# Patient Record
Sex: Male | Born: 1995
Health system: Southern US, Community
[De-identification: ages and names within clinical notes are randomized; demographics above are authoritative.]

---
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
## 2016-03-21 DIAGNOSIS — J329 Chronic sinusitis, unspecified: Secondary | ICD-10-CM | POA: Diagnosis not present

## 2016-03-21 DIAGNOSIS — J309 Allergic rhinitis, unspecified: Secondary | ICD-10-CM | POA: Diagnosis not present

## 2017-01-06 ENCOUNTER — Emergency Department (HOSPITAL_COMMUNITY): Payer: BLUE CROSS/BLUE SHIELD

## 2017-01-06 ENCOUNTER — Encounter (HOSPITAL_COMMUNITY): Payer: Self-pay | Admitting: *Deleted

## 2017-01-06 ENCOUNTER — Emergency Department (HOSPITAL_COMMUNITY)
Admission: EM | Admit: 2017-01-06 | Discharge: 2017-01-06 | Disposition: A | Discharge status: Home / Self Care | Payer: BLUE CROSS/BLUE SHIELD | Attending: Emergency Medicine | Admitting: Emergency Medicine

## 2017-01-06 DIAGNOSIS — M25562 Pain in left knee: Secondary | ICD-10-CM | POA: Diagnosis not present

## 2017-01-06 DIAGNOSIS — S20212A Contusion of left front wall of thorax, initial encounter: Secondary | ICD-10-CM | POA: Insufficient documentation

## 2017-01-06 DIAGNOSIS — Y9389 Activity, other specified: Secondary | ICD-10-CM | POA: Insufficient documentation

## 2017-01-06 DIAGNOSIS — S299XXA Unspecified injury of thorax, initial encounter: Secondary | ICD-10-CM | POA: Diagnosis not present

## 2017-01-06 DIAGNOSIS — Y92410 Unspecified street and highway as the place of occurrence of the external cause: Secondary | ICD-10-CM | POA: Diagnosis not present

## 2017-01-06 DIAGNOSIS — S80211A Abrasion, right knee, initial encounter: Secondary | ICD-10-CM | POA: Diagnosis not present

## 2017-01-06 DIAGNOSIS — Y999 Unspecified external cause status: Secondary | ICD-10-CM | POA: Diagnosis not present

## 2017-01-06 DIAGNOSIS — R079 Chest pain, unspecified: Secondary | ICD-10-CM | POA: Diagnosis not present

## 2017-01-06 DIAGNOSIS — S8992XA Unspecified injury of left lower leg, initial encounter: Secondary | ICD-10-CM | POA: Diagnosis not present

## 2017-01-06 DIAGNOSIS — S8991XA Unspecified injury of right lower leg, initial encounter: Secondary | ICD-10-CM | POA: Diagnosis not present

## 2017-01-06 DIAGNOSIS — M25561 Pain in right knee: Secondary | ICD-10-CM | POA: Diagnosis not present
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
## 2017-01-06 NOTE — ED Notes (Signed)
Pt given incentive spirometer and gave verbal instructions to him and mother on how to use this. Teach back method was used.

## 2017-01-06 NOTE — ED Notes (Signed)
ED Provider at bedside. 

## 2017-03-20 DIAGNOSIS — I10 Essential (primary) hypertension: Secondary | ICD-10-CM | POA: Diagnosis not present

## 2017-03-20 DIAGNOSIS — R5381 Other malaise: Secondary | ICD-10-CM | POA: Diagnosis not present

## 2017-03-20 DIAGNOSIS — Z0000 Encounter for general adult medical examination without abnormal findings: Secondary | ICD-10-CM | POA: Diagnosis not present

## 2017-03-31 DIAGNOSIS — R0789 Other chest pain: Secondary | ICD-10-CM | POA: Diagnosis not present

## 2017-03-31 DIAGNOSIS — R079 Chest pain, unspecified: Secondary | ICD-10-CM | POA: Diagnosis not present

## 2017-03-31 DIAGNOSIS — I319 Disease of pericardium, unspecified: Secondary | ICD-10-CM | POA: Diagnosis not present

## 2017-03-31 DIAGNOSIS — S299XXS Unspecified injury of thorax, sequela: Secondary | ICD-10-CM | POA: Diagnosis not present

## 2017-04-02 DIAGNOSIS — S299XXS Unspecified injury of thorax, sequela: Secondary | ICD-10-CM | POA: Diagnosis not present

## 2017-04-02 DIAGNOSIS — I319 Disease of pericardium, unspecified: Secondary | ICD-10-CM | POA: Diagnosis not present

## 2017-04-02 DIAGNOSIS — R079 Chest pain, unspecified: Secondary | ICD-10-CM | POA: Diagnosis not present

## 2017-04-09 ENCOUNTER — Emergency Department (HOSPITAL_COMMUNITY): Payer: BLUE CROSS/BLUE SHIELD

## 2017-04-09 ENCOUNTER — Emergency Department (HOSPITAL_COMMUNITY)
Admission: EM | Admit: 2017-04-09 | Discharge: 2017-04-09 | Disposition: A | Discharge status: Home / Self Care | Payer: BLUE CROSS/BLUE SHIELD | Attending: Emergency Medicine | Admitting: Emergency Medicine

## 2017-04-09 ENCOUNTER — Encounter (HOSPITAL_COMMUNITY): Payer: Self-pay | Admitting: Emergency Medicine

## 2017-04-09 DIAGNOSIS — R0789 Other chest pain: Secondary | ICD-10-CM | POA: Insufficient documentation

## 2017-04-09 DIAGNOSIS — S098XXA Other specified injuries of head, initial encounter: Secondary | ICD-10-CM | POA: Diagnosis not present

## 2017-04-09 DIAGNOSIS — Y92410 Unspecified street and highway as the place of occurrence of the external cause: Secondary | ICD-10-CM | POA: Insufficient documentation

## 2017-04-09 DIAGNOSIS — Y999 Unspecified external cause status: Secondary | ICD-10-CM | POA: Diagnosis not present

## 2017-04-09 DIAGNOSIS — S0990XA Unspecified injury of head, initial encounter: Secondary | ICD-10-CM | POA: Diagnosis not present

## 2017-04-09 DIAGNOSIS — S161XXA Strain of muscle, fascia and tendon at neck level, initial encounter: Secondary | ICD-10-CM | POA: Insufficient documentation

## 2017-04-09 DIAGNOSIS — R079 Chest pain, unspecified: Secondary | ICD-10-CM | POA: Diagnosis not present

## 2017-04-09 DIAGNOSIS — Y9389 Activity, other specified: Secondary | ICD-10-CM | POA: Diagnosis not present

## 2017-04-09 DIAGNOSIS — S199XXA Unspecified injury of neck, initial encounter: Secondary | ICD-10-CM | POA: Diagnosis not present

## 2017-04-09 MED ORDER — METHOCARBAMOL 500 MG PO TABS: 500.0000 mg | ORAL_TABLET | Freq: Two times a day (BID) | ORAL | 0 refills | Status: DC

## 2017-04-09 MED ORDER — IBUPROFEN 800 MG PO TABS: 800.0000 mg | ORAL_TABLET | Freq: Three times a day (TID) | ORAL | 0 refills | Status: DC | PRN

## 2017-04-09 NOTE — ED Notes (Signed)
Pt returned from xray
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
## 2017-04-09 NOTE — ED Notes (Signed)
Patient transported to X-ray 

## 2017-04-09 NOTE — Discharge Instructions (Signed)

## 2017-04-09 NOTE — ED Notes (Signed)
Patient is resting comfortably. 
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
## 2017-10-23 DIAGNOSIS — Z23 Encounter for immunization: Secondary | ICD-10-CM | POA: Diagnosis not present

## 2017-12-28 ENCOUNTER — Other Ambulatory Visit: Payer: Self-pay | Admitting: Cardiology

## 2017-12-28 ENCOUNTER — Ambulatory Visit
Admission: RE | Admit: 2017-12-28 | Discharge: 2017-12-28 | Disposition: A | Discharge status: Home / Self Care | Payer: BLUE CROSS/BLUE SHIELD | Source: Ambulatory Visit | Attending: Internal Medicine | Admitting: Internal Medicine

## 2017-12-28 ENCOUNTER — Ambulatory Visit
Admission: RE | Admit: 2017-12-28 | Discharge: 2017-12-28 | Disposition: A | Discharge status: Home / Self Care | Payer: BLUE CROSS/BLUE SHIELD | Source: Ambulatory Visit | Attending: Cardiology | Admitting: Cardiology

## 2017-12-28 DIAGNOSIS — R079 Chest pain, unspecified: Secondary | ICD-10-CM | POA: Diagnosis not present

## 2017-12-28 DIAGNOSIS — I319 Disease of pericardium, unspecified: Secondary | ICD-10-CM | POA: Diagnosis not present

## 2017-12-28 DIAGNOSIS — S299XXS Unspecified injury of thorax, sequela: Secondary | ICD-10-CM | POA: Diagnosis not present

## 2017-12-28 DIAGNOSIS — R0789 Other chest pain: Secondary | ICD-10-CM | POA: Diagnosis not present

## 2017-12-28 DIAGNOSIS — I7300 Raynaud's syndrome without gangrene: Secondary | ICD-10-CM | POA: Diagnosis not present

## 2017-12-28 DIAGNOSIS — R05 Cough: Secondary | ICD-10-CM | POA: Diagnosis not present

## 2017-12-29 DIAGNOSIS — I10 Essential (primary) hypertension: Secondary | ICD-10-CM | POA: Diagnosis not present

## 2017-12-29 DIAGNOSIS — R5381 Other malaise: Secondary | ICD-10-CM | POA: Diagnosis not present

## 2018-01-06 DIAGNOSIS — R0789 Other chest pain: Secondary | ICD-10-CM | POA: Diagnosis not present

## 2018-01-06 DIAGNOSIS — S299XXS Unspecified injury of thorax, sequela: Secondary | ICD-10-CM | POA: Diagnosis not present

## 2018-01-06 DIAGNOSIS — I7300 Raynaud's syndrome without gangrene: Secondary | ICD-10-CM | POA: Diagnosis not present

## 2018-01-06 DIAGNOSIS — I319 Disease of pericardium, unspecified: Secondary | ICD-10-CM | POA: Diagnosis not present

## 2018-03-16 DIAGNOSIS — R0789 Other chest pain: Secondary | ICD-10-CM | POA: Diagnosis not present

## 2018-03-16 DIAGNOSIS — I319 Disease of pericardium, unspecified: Secondary | ICD-10-CM | POA: Diagnosis not present

## 2018-03-16 DIAGNOSIS — S299XXS Unspecified injury of thorax, sequela: Secondary | ICD-10-CM | POA: Diagnosis not present

## 2018-03-16 DIAGNOSIS — I7300 Raynaud's syndrome without gangrene: Secondary | ICD-10-CM | POA: Diagnosis not present

## 2018-03-23 NOTE — Progress Notes (Signed)
03/24/2018 2:09 PM   Scott Sandoval [birth year] [id number]  Referring provider: Corky Downs, MD [address] [place], [place] [address]  Chief Complaint  Patient presents with  . New Patient (Initial Visit)    HPI: Patient is a 22 year old African American male referred by Dr. Corky Downs for testicular discomfort.    He states that he has been having left scrotal discomfort since mid March.  He has not noted any testicular swelling.  He states is not a sharp pain just sensitive.  He states the pain is intermittent.  He describes the pain is it just does not feel right.  He denied any injury to the scrotum, history of infection or history of surgery to his scrotum.  He also mentioned a history of spraying of the urine.  He states Dr. Harl Bowie gave him a topical cream to put on the end of his penis and spraying abated.    Patient denies any gross hematuria, dysuria or suprapubic/flank pain.  Patient denies any fevers, chills, nausea or vomiting.   He does not have a history of stones.    His UA is positive for 3-10 RBC's.  His PVR was 25 mL.     PMH: No past medical history on file.  Surgical History: No past surgical history on file.  Home Medications:  Allergies as of 03/24/2018   No Known Allergies     Medication List        Accurate as of 03/24/18 11:59 PM. Always use your most recent med list.          clindamycin-benzoyl peroxide gel Commonly known as:  BENZACLIN Apply daily to the face after washing. May need to start off every other day if drying occurs.   ibuprofen 800 MG tablet Commonly known as:  ADVIL,MOTRIN Take 1 tablet (800 mg total) by mouth every 8 (eight) hours as needed.   methocarbamol 500 MG tablet Commonly known as:  ROBAXIN Take 1 tablet (500 mg total) by mouth 2 (two) times daily.       Allergies: No Known Allergies  Family History: Family History  Problem Relation Age of Onset  . Kidney cancer Neg Hx   . Kidney disease Neg Hx    . Prostate cancer Neg Hx     Social History:  reports that he has never smoked. He has never used smokeless tobacco. He reports that he drinks alcohol. He reports that he has current or past drug history. Drug: Marijuana.  ROS: UROLOGY Frequent Urination?: No Hard to postpone urination?: No Burning/pain with urination?: No Get up at night to urinate?: No Leakage of urine?: No Urine stream starts and stops?: No Trouble starting stream?: No Do you have to strain to urinate?: No Blood in urine?: No Urinary tract infection?: No Sexually transmitted disease?: No Injury to kidneys or bladder?: No Painful intercourse?: No Weak stream?: No Erection problems?: No Penile pain?: No  Gastrointestinal Nausea?: No Vomiting?: No Indigestion/heartburn?: No Diarrhea?: No Constipation?: No  Constitutional Fever: No Night sweats?: No Weight loss?: No Fatigue?: No  Skin Skin rash/lesions?: No Itching?: No  Eyes Blurred vision?: No Double vision?: No  Ears/Nose/Throat Sore throat?: No Sinus problems?: No  Hematologic/Lymphatic Swollen glands?: No Easy bruising?: No  Cardiovascular Leg swelling?: No Chest pain?: No  Respiratory Cough?: No Shortness of breath?: No  Endocrine Excessive thirst?: No  Musculoskeletal Back pain?: No Joint pain?: No  Neurological Headaches?: No Dizziness?: No  Psychologic Depression?: No Anxiety?: No  Physical  Exam: BP 124/87   Pulse 85   Ht 5\' 11"  (1.803 m)   Wt 140 lb (63.5 kg)   BMI 19.53 kg/m   Constitutional: Well nourished. Alert and oriented, No acute distress. HEENT: [place] AT, moist mucus membranes. Trachea midline, no masses. Cardiovascular: No clubbing, cyanosis, or edema. Respiratory: Normal respiratory effort, no increased work of breathing. GI: Abdomen is soft, non tender, non distended, no abdominal masses. Liver and spleen not palpable.  No hernias appreciated.  Stool sample for occult testing is not  indicated.   GU: No CVA tenderness.  No bladder fullness or masses.  Patient with circumcised phallus.  Urethral meatus is patent.  No penile discharge. No penile lesions or rashes. Scrotum without lesions, cysts, rashes and/or edema.  Testicles are located scrotally bilaterally. No masses are appreciated in the testicles. Left and right epididymis are normal.  Small left varicocele, Grade I.    Rectal: Patient with  normal sphincter tone. Anus and perineum without scarring or rashes. No rectal masses are appreciated. Prostate is approximately 25 grams, no nodules are appreciated. Seminal vesicles are normal. Skin: No rashes, bruises or suspicious lesions. Lymph: No cervical or inguinal adenopathy. Neurologic: Grossly intact, no focal deficits, moving all 4 extremities. Psychiatric: Normal mood and affect.  Laboratory Data: No results found for: WBC, HGB, HCT, MCV, PLT  No results found for: CREATININE  No results found for: PSA  No results found for: TESTOSTERONE  No results found for: HGBA1C  No results found for: TSH  No results found for: CHOL, HDL, CHOLHDL, VLDL, LDLCALC  No results found for: AST No results found for: ALT No components found for: ALKALINEPHOPHATASE No components found for: BILIRUBINTOTAL  No results found for: ESTRADIOL  Urinalysis 3-10 RBC's.  See Epic.   I have reviewed the labs.   Pertinent Imaging: Results for Scott CoupeWIMPLE, Erinn (MRN [id number]) as of 04/02/2018 14:10  Ref. Range 03/24/2018 14:48  Scan Result Unknown 25      Assessment & Plan:    1. Testicular discomfort Likely due to a small varicocele, will treat conservatively at this time Will address further once CTU and/or infection is treated  2.  Microscopic hematuria I explained to the patient that there are a number of causes that can be associated with blood in the urine, such as stones, BPH, UTI's, damage to the urinary tract and/or cancer. I will send the urine for culture- if it  returns negative - will pursue CTU and if infection will treat and recheck I explained to the patient that a contrast material will be injected into a vein and that in rare instances, an allergic reaction can result and may even life threatening   The patient denies any allergies to contrast, iodine and/or seafood and is not taking metformin The patient had the opportunity to ask questions which were answered. Based upon this discussion, the patient is willing to proceed if urine culture is negative.    - UA  - Urine culture   Return for pending urine culture results .  These notes generated with voice recognition software. I apologize for typographical errors.  Michiel CowboySHANNON Sair Faulcon, PA-C  [facility] [address], [address] [place], [place] [address] [phone]

## 2018-03-24 ENCOUNTER — Encounter: Payer: Self-pay | Admitting: Urology

## 2018-03-24 ENCOUNTER — Ambulatory Visit (INDEPENDENT_AMBULATORY_CARE_PROVIDER_SITE_OTHER): Payer: BLUE CROSS/BLUE SHIELD | Admitting: Urology

## 2018-03-24 VITALS — BP 124/87 | HR 85 | Ht 71.0 in | Wt 140.0 lb

## 2018-03-24 DIAGNOSIS — R3129 Other microscopic hematuria: Secondary | ICD-10-CM

## 2018-03-24 DIAGNOSIS — N50819 Testicular pain, unspecified: Secondary | ICD-10-CM

## 2018-03-24 LAB — URINALYSIS, COMPLETE
BILIRUBIN UA: NEGATIVE
GLUCOSE, UA: NEGATIVE
Leukocytes, UA: NEGATIVE
Nitrite, UA: NEGATIVE
PROTEIN UA: NEGATIVE
RBC UA: NEGATIVE
SPEC GRAV UA: 1.025 (ref 1.005–1.030)
Urobilinogen, Ur: 1 mg/dL (ref 0.2–1.0)
pH, UA: 6.5 (ref 5.0–7.5)

## 2018-03-24 LAB — MICROSCOPIC EXAMINATION: Bacteria, UA: NONE SEEN

## 2018-03-24 LAB — BLADDER SCAN AMB NON-IMAGING: Scan Result: 25

## 2018-03-25 ENCOUNTER — Encounter: Payer: Self-pay | Admitting: Urology

## 2018-03-27 LAB — CULTURE, URINE COMPREHENSIVE

## 2018-03-29 ENCOUNTER — Other Ambulatory Visit: Payer: Self-pay | Admitting: Urology

## 2018-03-29 ENCOUNTER — Telehealth: Payer: Self-pay | Admitting: Radiology

## 2018-03-29 DIAGNOSIS — R3129 Other microscopic hematuria: Secondary | ICD-10-CM

## 2018-03-29 NOTE — Telephone Encounter (Signed)
Made pt aware of negative urine culture & need for CT Urogram. Appt made for results & pt aware that radiology scheduling will call him to schedule CT. Pt voices understanding of whole conversation.

## 2018-03-29 NOTE — Telephone Encounter (Signed)
Pt requesting urine culture results & questioning plan for follow up. Pt doesn't currently have a follow up appt & note isn't finished. Please advise.

## 2018-03-29 NOTE — Progress Notes (Signed)
CTU orders are in the chart.  

## 2018-03-29 NOTE — Telephone Encounter (Signed)
His urine culture is negative.  We need to proceed with the CTU.  I've placed orders for the CTU in the chart.

## 2018-04-05 ENCOUNTER — Ambulatory Visit
Admission: RE | Admit: 2018-04-05 | Discharge: 2018-04-05 | Disposition: A | Discharge status: Home / Self Care | Payer: BLUE CROSS/BLUE SHIELD | Source: Ambulatory Visit | Attending: Urology | Admitting: Urology

## 2018-04-05 DIAGNOSIS — R3129 Other microscopic hematuria: Secondary | ICD-10-CM | POA: Diagnosis not present

## 2018-04-05 MED ORDER — IOPAMIDOL (ISOVUE-300) INJECTION 61%
150.0000 mL | Freq: Once | INTRAVENOUS | Status: AC | PRN
  Administered 2018-04-05: 150 mL via INTRAVENOUS

## 2018-04-16 ENCOUNTER — Ambulatory Visit: Payer: Self-pay

## 2018-04-23 ENCOUNTER — Ambulatory Visit: Payer: BLUE CROSS/BLUE SHIELD | Admitting: Urology

## 2018-04-27 ENCOUNTER — Ambulatory Visit: Payer: BLUE CROSS/BLUE SHIELD | Admitting: Urology

## 2018-05-16 ENCOUNTER — Encounter: Payer: Self-pay | Admitting: Urology

## 2018-06-24 DIAGNOSIS — I7300 Raynaud's syndrome without gangrene: Secondary | ICD-10-CM | POA: Diagnosis not present

## 2018-06-24 DIAGNOSIS — K119 Disease of salivary gland, unspecified: Secondary | ICD-10-CM | POA: Diagnosis not present

## 2018-10-20 DIAGNOSIS — K119 Disease of salivary gland, unspecified: Secondary | ICD-10-CM | POA: Diagnosis not present

## 2018-10-20 DIAGNOSIS — H6692 Otitis media, unspecified, left ear: Secondary | ICD-10-CM | POA: Diagnosis not present

## 2018-10-20 DIAGNOSIS — I7300 Raynaud's syndrome without gangrene: Secondary | ICD-10-CM | POA: Diagnosis not present

## 2019-01-12 IMAGING — CT CT ABD-PEL WO/W CM
2 of 9 series · 14 of 46 positions shown, 18 images · IV contrast (iopamidol)
Comparison: None.

CLINICAL DATA: Microscopic hematuria.

EXAM:
CT ABDOMEN AND PELVIS WITHOUT AND WITH CONTRAST
TECHNIQUE: Multidetector CT imaging of the abdomen and pelvis was performed
following the standard protocol before and following the bolus
administration of intravenous contrast.
CONTRAST:  150mL 4JHFB9-TWW IOPAMIDOL (4JHFB9-TWW) INJECTION 61%

[Series 5: cor without without pre · coronal · non-contrast · 0.62mm/px · 3 of 121 slices shown]
[im 31/121  soft-tissue]
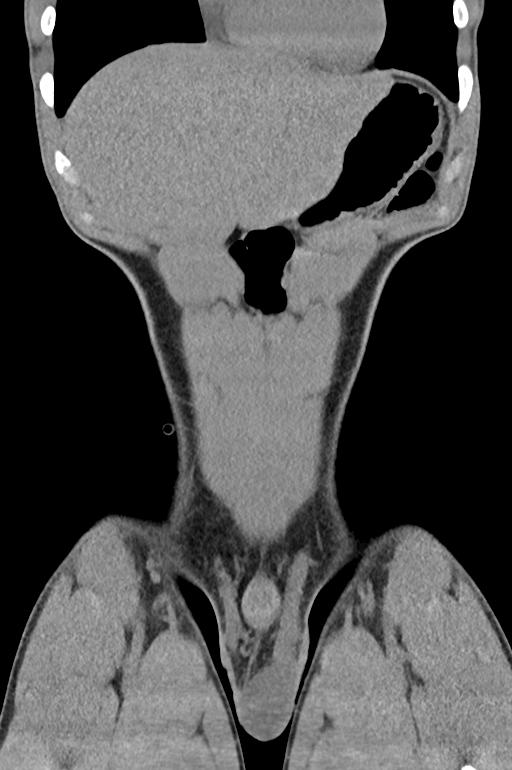
[im 61/121  soft-tissue]
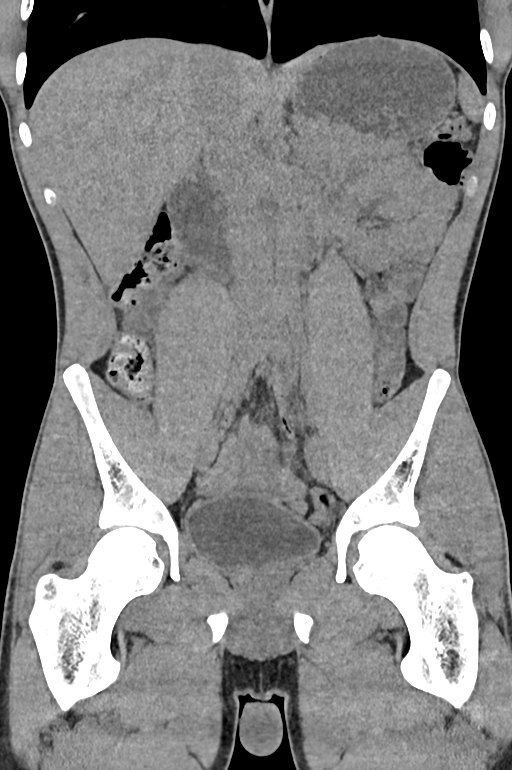
[im 91/121  soft-tissue]
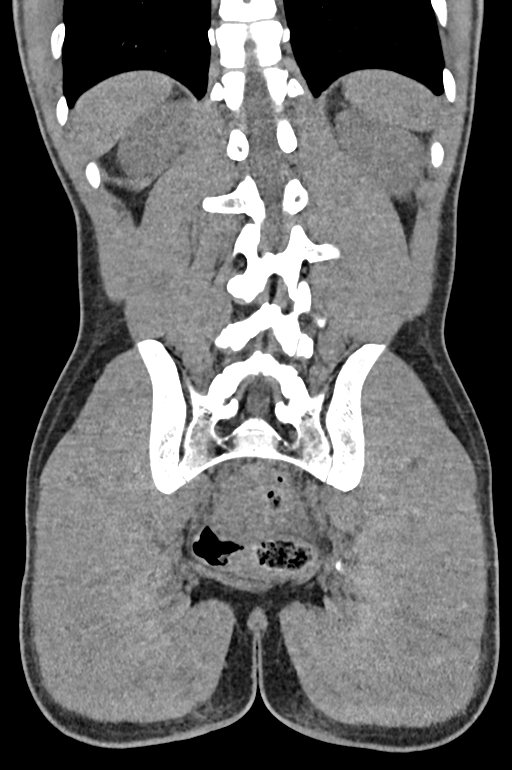

[Series 18: axial delay thins prone · axial · delayed · 0.56mm/px · z∈[-1557,-1099]mm · 11 of 345 slices shown, 15 images]
[im 20/345  soft-tissue]
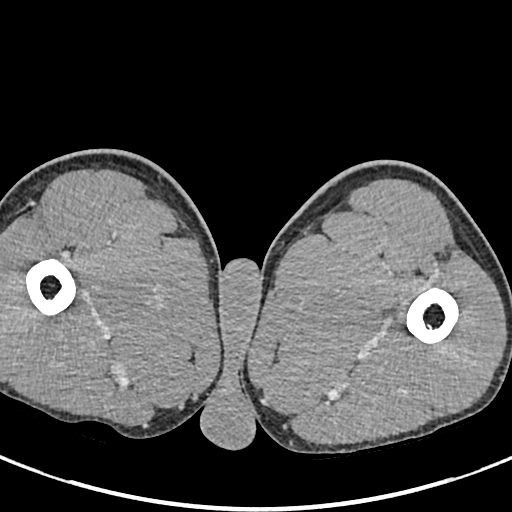
[im 20/345  bone]
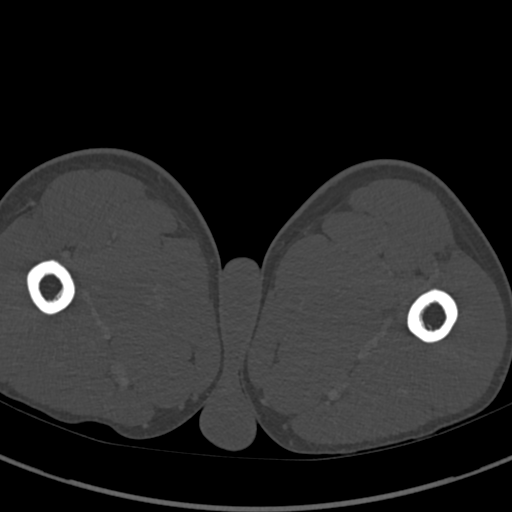
[im 58/345  soft-tissue]
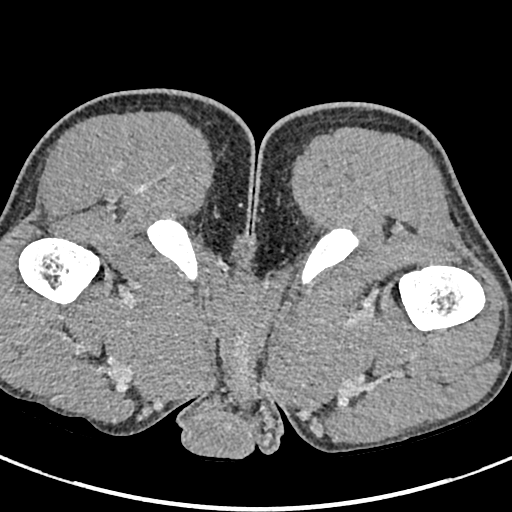
[im 96/345  soft-tissue]
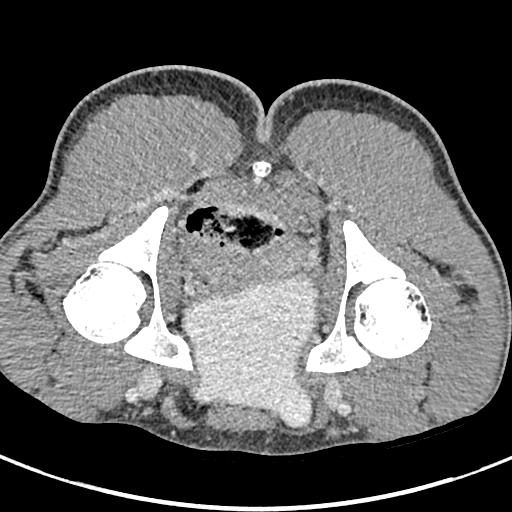
[im 134/345  soft-tissue]
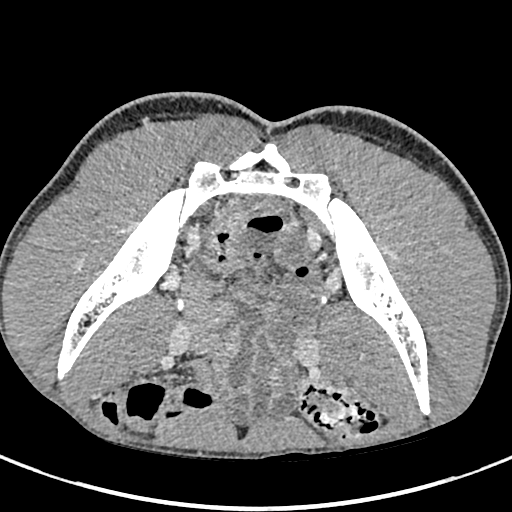
[im 173/345  soft-tissue]
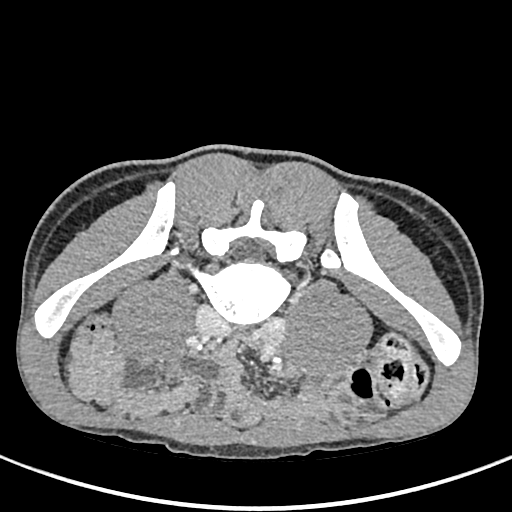
[im 211/345  soft-tissue]
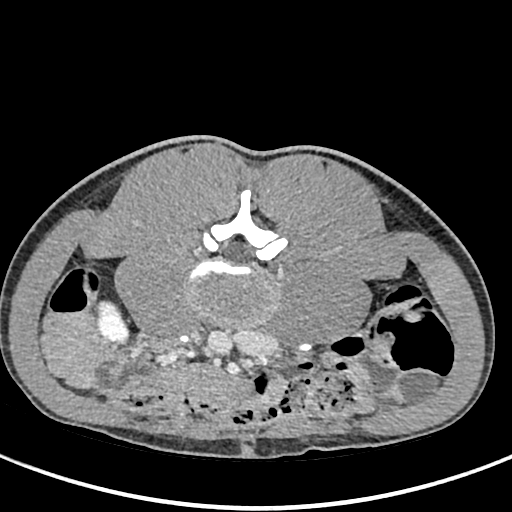
[im 249/345  soft-tissue]
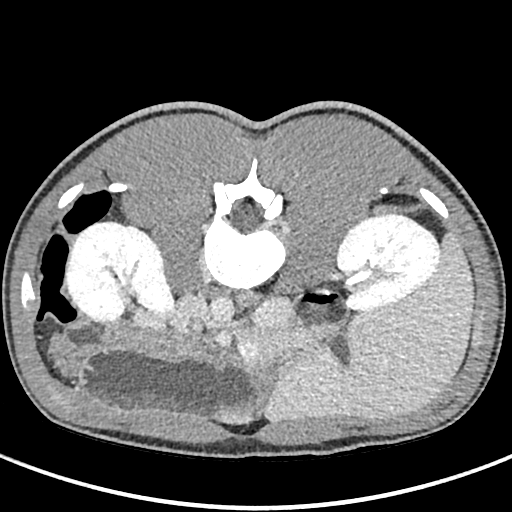
[im 268/345  lung]
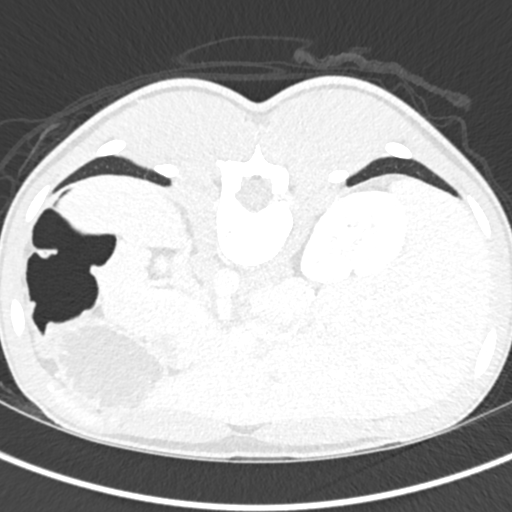
[im 287/345  soft-tissue]
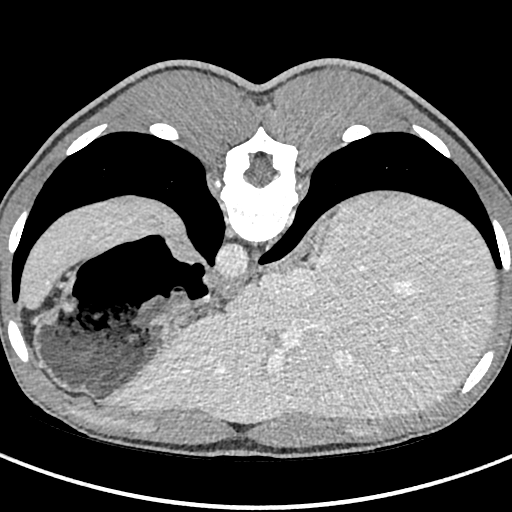
[im 287/345  lung]
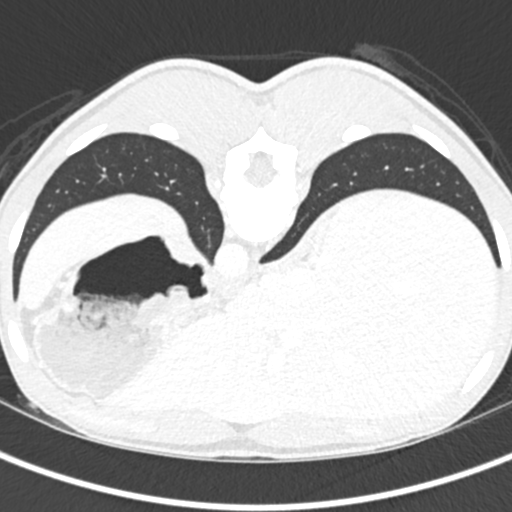
[im 306/345  lung]
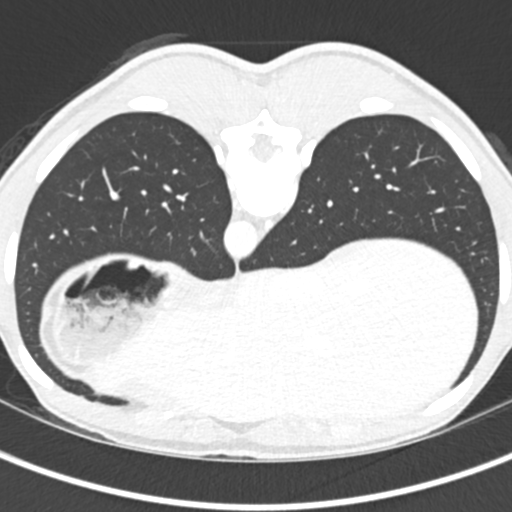
[im 325/345  soft-tissue]
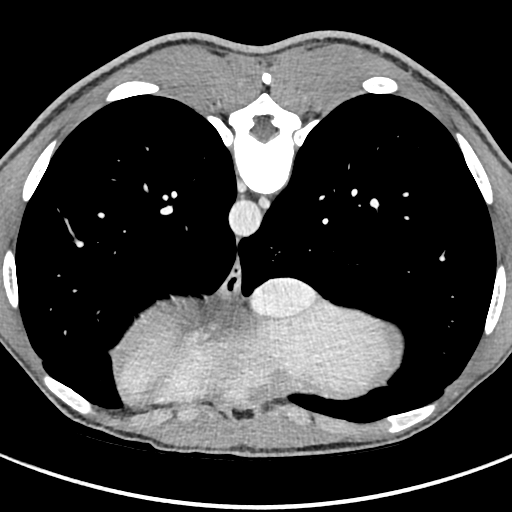
[im 325/345  lung]
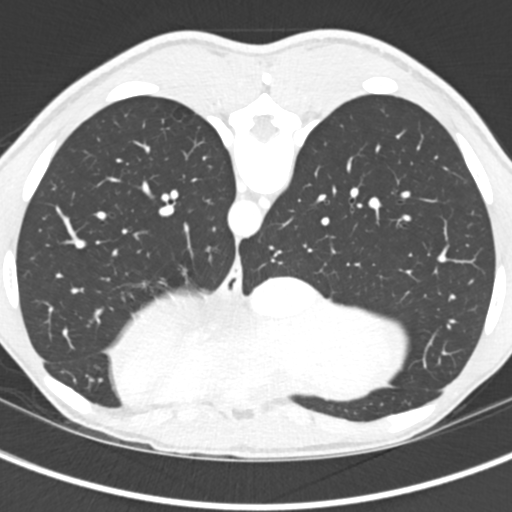
[im 325/345  bone]
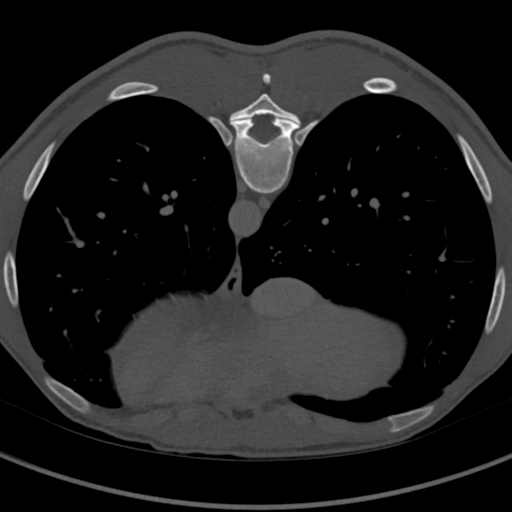

[14 of 46 positions shown; findings below may reference images not displayed]

FINDINGS: Lower Chest: No acute findings.

Hepatobiliary: No hepatic masses identified. Gallbladder is
unremarkable.

Pancreas:  No mass or inflammatory changes.

Spleen: Within normal limits in size and appearance.

Adrenals/Urinary Tract: No adrenal masses identified. No evidence of
urolithiasis or hydronephrosis. No complex cystic or solid renal
masses identified. No masses seen involving the ureters or bladder.

Stomach/Bowel: No evidence of obstruction, inflammatory process or
abnormal fluid collections.

Vascular/Lymphatic: No pathologically enlarged lymph nodes. No
abdominal aortic aneurysm.

Reproductive:  No mass or other significant abnormality.

Other:  None.

Musculoskeletal:  No suspicious bone lesions identified.
IMPRESSION: Negative. No radiographic evidence of urinary tract neoplasm,
urolithiasis, or hydronephrosis.

## 2020-04-19 DIAGNOSIS — Z23 Encounter for immunization: Secondary | ICD-10-CM | POA: Diagnosis not present

## 2020-12-18 DIAGNOSIS — Z23 Encounter for immunization: Secondary | ICD-10-CM | POA: Diagnosis not present

## 2021-08-14 ENCOUNTER — Ambulatory Visit (INDEPENDENT_AMBULATORY_CARE_PROVIDER_SITE_OTHER): Payer: BC Managed Care – PPO | Admitting: Internal Medicine

## 2021-08-14 ENCOUNTER — Other Ambulatory Visit: Payer: Self-pay

## 2021-08-14 ENCOUNTER — Encounter: Payer: Self-pay | Admitting: Internal Medicine

## 2021-08-14 VITALS — BP 124/85 | HR 70 | Ht 71.0 in | Wt 147.6 lb

## 2021-08-14 DIAGNOSIS — G8929 Other chronic pain: Secondary | ICD-10-CM

## 2021-08-14 DIAGNOSIS — F5221 Male erectile disorder: Secondary | ICD-10-CM

## 2021-08-14 DIAGNOSIS — M545 Low back pain, unspecified: Secondary | ICD-10-CM | POA: Diagnosis not present

## 2021-08-14 NOTE — Assessment & Plan Note (Signed)
I did a counseling on the patient problem of him potentially, no medicines were given.

## 2021-08-14 NOTE — Progress Notes (Signed)
New Patient Office Visit  Subjective:  Patient ID: Scott Whitecotton., male    DOB: Oct 28, 1996  Age: 25 y.o. MRN: [id number]  CC:  Chief Complaint  Patient presents with   New Patient (Initial Visit)    HPI Patient presents for check up  History reviewed. No pertinent past medical history.  No current outpatient medications on file.   History reviewed. No pertinent surgical history.  Family History  Problem Relation Age of Onset   Diabetes Paternal Grandmother    Kidney cancer Neg Hx    Kidney disease Neg Hx    Prostate cancer Neg Hx     Social History   Socioeconomic History   Marital status: Single    Spouse name: Not on file   Number of children: Not on file   Years of education: Not on file   Highest education level: Not on file  Occupational History   Not on file  Tobacco Use   Smoking status: Never   Smokeless tobacco: Never  Substance and Sexual Activity   Alcohol use: Yes    Comment: occ.   Drug use: Not Currently    Types: Marijuana   Sexual activity: Yes  Other Topics Concern   Not on file  Social History Narrative   Not on file   Social Determinants of Health   Financial Resource Strain: Not on file  Food Insecurity: Not on file  Transportation Needs: Not on file  Physical Activity: Not on file  Stress: Not on file  Social Connections: Not on file  Intimate Partner Violence: Not on file    ROS Review of Systems  Constitutional: Negative.   HENT: Negative.    Eyes: Negative.   Respiratory: Negative.    Cardiovascular: Negative.   Gastrointestinal: Negative.   Endocrine: Negative.   Genitourinary: Negative.  Negative for frequency, hematuria and penile discharge.  Musculoskeletal:  Positive for arthralgias, back pain and neck stiffness. Negative for gait problem, joint swelling and myalgias.  Skin: Negative.   Allergic/Immunologic: Negative.   Neurological: Negative.   Hematological: Negative.   Psychiatric/Behavioral:  Negative.    All other systems reviewed and are negative.  Objective:   Today's Vitals: BP 124/85   Pulse 70   Ht 5\' 11"  (1.803 m)   Wt 147 lb 9.6 oz (67 kg)   BMI 20.59 kg/m   Physical Exam  On physical examination patient is a well-nourished in no acute distress HEENT examination is normal neck is supple.  Jugular venous pressure is not elevated Thyroid is not enlarged without any goiter or nodules. No lymphadenopathy is noted. Cardiovascular system examination revealed apical impulse to be palpable in the fifth intercostal space first heart sounds normal second heart sounds normal no murmur is audible.  On examination of the chest chest is clear on auscultation resonant on percussion.  Abdominal examination reveals abdomen to be soft nontender there is no hepatosplenomegaly bowel sounds are audible no tenderness rigidity or rebound tenderness noted. There is no calf tenderness or pedal edema. Neurological examination is un remarkable psychiatric examination is unremarkable. Locomotor system examination is unremarkable  Assessment & Plan:   Problem List Items Addressed This Visit       Other   Impotence of non-organic origin    I did a counseling on the patient problem of him potentially, no medicines were given.      Chronic bilateral low back pain without sciatica - Primary    - Patient's back pain is under  control with medication.  - Encouraged the patient to stretch or do yoga as able to help with back pain       Outpatient Encounter Medications as of 08/14/2021  Medication Sig   [DISCONTINUED] clindamycin-benzoyl peroxide (BENZACLIN) gel Apply daily to the face after washing. May need to start off every other day if drying occurs. (Patient not taking: Reported on 01/06/2017)   [DISCONTINUED] ibuprofen (ADVIL,MOTRIN) 800 MG tablet Take 1 tablet (800 mg total) by mouth every 8 (eight) hours as needed. (Patient not taking: Reported on 03/24/2018)   [DISCONTINUED]  methocarbamol (ROBAXIN) 500 MG tablet Take 1 tablet (500 mg total) by mouth 2 (two) times daily. (Patient not taking: Reported on 03/24/2018)   No facility-administered encounter medications on file as of 08/14/2021.    Follow-up: No follow-ups on file.   Corky Downs, MD

## 2021-08-14 NOTE — Assessment & Plan Note (Signed)
-   Patient's back pain is under control with medication.  - Encouraged the patient to stretch or do yoga as able to help with back pain 

## 2021-08-20 ENCOUNTER — Other Ambulatory Visit: Payer: BC Managed Care – PPO

## 2021-08-20 ENCOUNTER — Other Ambulatory Visit: Payer: Self-pay

## 2021-08-20 DIAGNOSIS — Z0000 Encounter for general adult medical examination without abnormal findings: Secondary | ICD-10-CM

## 2021-08-21 LAB — CBC WITH DIFFERENTIAL/PLATELET
Absolute Monocytes: 475 cells/uL (ref 200–950)
Basophils Absolute: 21 cells/uL (ref 0–200)
Basophils Relative: 0.5 %
Eosinophils Absolute: 248 cells/uL (ref 15–500)
Eosinophils Relative: 5.9 %
HCT: 46.3 % (ref 38.5–50.0)
Hemoglobin: 15.3 g/dL (ref 13.2–17.1)
Lymphs Abs: 1222 cells/uL (ref 850–3900)
MCH: 31 pg (ref 27.0–33.0)
MCHC: 33 g/dL (ref 32.0–36.0)
MCV: 93.9 fL (ref 80.0–100.0)
MPV: 10.2 fL (ref 7.5–12.5)
Monocytes Relative: 11.3 %
Neutro Abs: 2234 cells/uL (ref 1500–7800)
Neutrophils Relative %: 53.2 %
Platelets: 278 10*3/uL (ref 140–400)
RBC: 4.93 10*6/uL (ref 4.20–5.80)
RDW: 12.4 % (ref 11.0–15.0)
Total Lymphocyte: 29.1 %
WBC: 4.2 10*3/uL (ref 3.8–10.8)

## 2021-08-21 LAB — LIPID PANEL
Cholesterol: 153 mg/dL (ref <200)
HDL: 63 mg/dL (ref >=40)
LDL Cholesterol (Calc): 76 mg/dL (calc)
Non-HDL Cholesterol (Calc): 90 mg/dL (calc) (ref <130)
Total CHOL/HDL Ratio: 2.4 (calc) (ref <5.0)
Triglycerides: 63 mg/dL (ref <150)

## 2021-08-21 LAB — COMPLETE METABOLIC PANEL WITH GFR
AG Ratio: 1.7 (calc) (ref 1.0–2.5)
ALT: 9 U/L (ref 9–46)
AST: 13 U/L (ref 10–40)
Albumin: 4.7 g/dL (ref 3.6–5.1)
Alkaline phosphatase (APISO): 41 U/L (ref 36–130)
BUN: 11 mg/dL (ref 7–25)
CO2: 25 mmol/L (ref 20–32)
Calcium: 10.2 mg/dL (ref 8.6–10.3)
Chloride: 102 mmol/L (ref 98–110)
Creat: 0.98 mg/dL (ref 0.60–1.24)
Globulin: 2.7 g/dL (calc) (ref 1.9–3.7)
Glucose, Bld: 100 mg/dL — ABNORMAL HIGH (ref 65–99)
Potassium: 4.6 mmol/L (ref 3.5–5.3)
Sodium: 137 mmol/L (ref 135–146)
Total Bilirubin: 0.6 mg/dL (ref 0.2–1.2)
Total Protein: 7.4 g/dL (ref 6.1–8.1)
eGFR: 110 mL/min/{1.73_m2} (ref >=60)

## 2021-08-21 LAB — TSH: TSH: 1.67 mIU/L (ref 0.40–4.50)

## 2022-04-02 ENCOUNTER — Encounter: Payer: Self-pay | Admitting: Internal Medicine

## 2022-04-02 ENCOUNTER — Ambulatory Visit: Payer: BC Managed Care – PPO | Admitting: Internal Medicine

## 2022-04-02 VITALS — BP 140/86 | HR 74 | Ht 71.0 in | Wt 143.0 lb

## 2022-04-02 DIAGNOSIS — F5221 Male erectile disorder: Secondary | ICD-10-CM | POA: Diagnosis not present

## 2022-04-02 DIAGNOSIS — M545 Low back pain, unspecified: Secondary | ICD-10-CM

## 2022-04-02 DIAGNOSIS — G8929 Other chronic pain: Secondary | ICD-10-CM | POA: Diagnosis not present

## 2022-04-02 NOTE — Assessment & Plan Note (Signed)
-   Patient's back pain is under control with medication.  - Encouraged the patient to stretch or do yoga as able to help with back pain 

## 2022-04-02 NOTE — Progress Notes (Signed)
? ?Established Patient Office Visit ? ?Subjective:  ?Patient ID: Scott Grave., male    DOB: 01-26-1996  Age: 26 y.o. MRN: [id number] ? ?CC:  ?Chief Complaint  ?Patient presents with  ? Follow-up  ?  Patient is here for 6 month follow up  ? ? ?HPI ? ?Scott Simas Hagerman Jr. presents for general checkup he denies any chest pain or shortness of breath does not have any weakness of the leg no trouble walking ? ?No past medical history on file. ? ?No past surgical history on file. ? ?Family History  ?Problem Relation Age of Onset  ? Diabetes Paternal Grandmother   ? Kidney cancer Neg Hx   ? Kidney disease Neg Hx   ? Prostate cancer Neg Hx   ? ? ?Social History  ? ?Socioeconomic History  ? Marital status: Single  ?  Spouse name: Not on file  ? Number of children: Not on file  ? Years of education: Not on file  ? Highest education level: Not on file  ?Occupational History  ? Not on file  ?Tobacco Use  ? Smoking status: Never  ? Smokeless tobacco: Never  ?Substance and Sexual Activity  ? Alcohol use: Yes  ?  Comment: occ.  ? Drug use: Not Currently  ?  Types: Marijuana  ? Sexual activity: Yes  ?Other Topics Concern  ? Not on file  ?Social History Narrative  ? Not on file  ? ?Social Determinants of Health  ? ?Financial Resource Strain: Not on file  ?Food Insecurity: Not on file  ?Transportation Needs: Not on file  ?Physical Activity: Not on file  ?Stress: Not on file  ?Social Connections: Not on file  ?Intimate Partner Violence: Not on file  ? ? ?No current outpatient medications on file.  ? ?No Known Allergies ? ?ROS ?Review of Systems  ?Constitutional: Negative.   ?HENT: Negative.    ?Eyes: Negative.   ?Respiratory: Negative.    ?Cardiovascular: Negative.   ?Gastrointestinal: Negative.   ?Endocrine: Negative.   ?Genitourinary: Negative.   ?Musculoskeletal: Negative.   ?Skin: Negative.   ?Allergic/Immunologic: Negative.   ?Neurological: Negative.   ?Hematological: Negative.   ?Psychiatric/Behavioral: Negative.    ?All  other systems reviewed and are negative. ? ?  ?Objective:  ?  ?Physical Exam ?Vitals reviewed.  ?Constitutional:   ?   Appearance: Normal appearance.  ?HENT:  ?   Mouth/Throat:  ?   Mouth: Mucous membranes are moist.  ?Eyes:  ?   Pupils: Pupils are equal, round, and reactive to light.  ?Neck:  ?   Vascular: No carotid bruit.  ?Cardiovascular:  ?   Rate and Rhythm: Normal rate and regular rhythm.  ?   Pulses: Normal pulses.  ?   Heart sounds: Normal heart sounds.  ?Pulmonary:  ?   Effort: Pulmonary effort is normal.  ?   Breath sounds: Normal breath sounds.  ?Abdominal:  ?   General: Bowel sounds are normal.  ?   Palpations: Abdomen is soft. There is no hepatomegaly, splenomegaly or mass.  ?   Tenderness: There is no abdominal tenderness.  ?   Hernia: No hernia is present.  ?Musculoskeletal:  ?   Cervical back: Neck supple.  ?   Right lower leg: No edema.  ?   Left lower leg: No edema.  ?Skin: ?   Findings: No rash.  ?Neurological:  ?   Mental Status: He is alert and oriented to person, place, and time.  ?  Motor: No weakness.  ?Psychiatric:     ?   Mood and Affect: Mood normal.     ?   Behavior: Behavior normal.  ? ? ?BP 140/86   Pulse 74   Ht 5' 11"  (1.803 m)   Wt 143 lb (64.9 kg)   BMI 19.94 kg/m?  ?Wt Readings from Last 3 Encounters:  ?04/02/22 143 lb (64.9 kg)  ?08/14/21 147 lb 9.6 oz (67 kg)  ?03/24/18 140 lb (63.5 kg)  ? ? ? ?Health Maintenance Due  ?Topic Date Due  ? HPV VACCINES (1 - Male 2-dose series) Never done  ? HIV Screening  Never done  ? Hepatitis C Screening  Never done  ? TETANUS/TDAP  07/15/2017  ? ? ?   ?Topic Date Due  ? HPV VACCINES (1 - Male 2-dose series) Never done  ? ? ?Lab Results  ?Component Value Date  ? TSH 1.67 08/20/2021  ? ?Lab Results  ?Component Value Date  ? WBC 4.2 08/20/2021  ? HGB 15.3 08/20/2021  ? HCT 46.3 08/20/2021  ? MCV 93.9 08/20/2021  ? PLT 278 08/20/2021  ? ?Lab Results  ?Component Value Date  ? NA 137 08/20/2021  ? K 4.6 08/20/2021  ? CO2 25 08/20/2021  ? GLUCOSE  100 (H) 08/20/2021  ? BUN 11 08/20/2021  ? CREATININE 0.98 08/20/2021  ? BILITOT 0.6 08/20/2021  ? AST 13 08/20/2021  ? ALT 9 08/20/2021  ? PROT 7.4 08/20/2021  ? CALCIUM 10.2 08/20/2021  ? EGFR 110 08/20/2021  ? ?Lab Results  ?Component Value Date  ? CHOL 153 08/20/2021  ? ?Lab Results  ?Component Value Date  ? HDL 63 08/20/2021  ? ?Lab Results  ?Component Value Date  ? [place] 76 08/20/2021  ? ?Lab Results  ?Component Value Date  ? TRIG 63 08/20/2021  ? ?Lab Results  ?Component Value Date  ? CHOLHDL 2.4 08/20/2021  ? ?No results found for: HGBA1C ? ?  ?Assessment & Plan:  ? ?Problem List Items Addressed This Visit   ? ?  ? Other  ? Impotence of non-organic origin  ?  It is under control, no complaints at the present time ? ?  ?  ? Chronic bilateral low back pain without sciatica - Primary  ?  - Patient's back pain is under control with medication.  ?- Encouraged the patient to stretch or do yoga as able to help with back pain ? ?  ?  ? ? ?No orders of the defined types were placed in this encounter. ? ? ?Follow-up: No follow-ups on file.  ? ? ?Cletis Athens, MD ?

## 2022-04-02 NOTE — Assessment & Plan Note (Signed)
It is under control, no complaints at the present time ?

## 2022-06-30 ENCOUNTER — Ambulatory Visit: Payer: Self-pay | Admitting: Internal Medicine

## 2022-07-01 ENCOUNTER — Ambulatory Visit: Payer: Self-pay | Admitting: Internal Medicine

## 2022-10-21 ENCOUNTER — Encounter: Payer: Self-pay | Admitting: Physician Assistant

## 2022-10-21 ENCOUNTER — Ambulatory Visit: Payer: 59 | Admitting: Physician Assistant

## 2022-10-21 VITALS — BP 137/85 | HR 75 | Resp 14 | Ht 71.0 in | Wt 150.0 lb

## 2022-10-21 DIAGNOSIS — J301 Allergic rhinitis due to pollen: Secondary | ICD-10-CM | POA: Diagnosis not present

## 2022-10-21 DIAGNOSIS — R5383 Other fatigue: Secondary | ICD-10-CM | POA: Diagnosis not present

## 2022-10-21 DIAGNOSIS — Z7689 Persons encountering health services in other specified circumstances: Secondary | ICD-10-CM

## 2022-10-21 MED ORDER — FLUTICASONE PROPIONATE 50 MCG/ACT NA SUSP: 2.0000 | Freq: Every day | NASAL | 6 refills | Status: DC

## 2022-10-21 MED ORDER — CETIRIZINE HCL 10 MG PO TABS: 10.0000 mg | ORAL_TABLET | Freq: Every day | ORAL | 11 refills | Status: DC

## 2022-10-21 NOTE — Progress Notes (Signed)
I,April Miller,acting as a Education administrator for [employer], PA-C.,have documented all relevant documentation on the behalf of Mardene Speak, PA-C,as directed by  [employer], PA-C while in the presence of [employer], PA-C.  New patient visit   Patient: Scott Sandoval.   DOB: [birth year]   26 y.o. Male  MRN: [id number] Visit Date: 10/21/2022  Today's healthcare provider: Mardene Speak, PA-C   Chief Complaint  Patient presents with  . Establish Care   Subjective    Scott Sandoval. is a 26 y.o. male who presents today as a new patient to establish care.  HPI   - Wanted to be checked for sleep apnea due to daytime tiredness/sleepiness  Per girlfriend , he is drooling a lot but  No complains. Pt was last seen by his previous PCP on 04/02/22 for 6 mo FU and low back pain History reviewed. No pertinent past medical history. History reviewed. No pertinent surgical history. Family Status  Relation Name Status  . PGM  (Not Specified)  . Neg Hx  (Not Specified)   Family History  Problem Relation Age of Onset  . Diabetes Paternal Grandmother   . Kidney cancer Neg Hx   . Kidney disease Neg Hx   . Prostate cancer Neg Hx    Social History   Socioeconomic History  . Marital status: Single    Spouse name: Not on file  . Number of children: Not on file  . Years of education: Not on file  . Highest education level: Not on file  Occupational History  . Not on file  Tobacco Use  . Smoking status: Never  . Smokeless tobacco: Never  Substance and Sexual Activity  . Alcohol use: Yes    Comment: occ.  . Drug use: Not Currently    Types: Marijuana  . Sexual activity: Yes  Other Topics Concern  . Not on file  Social History Narrative  . Not on file   Social Determinants of Health   Financial Resource Strain: Not on file  Food Insecurity: Not on file  Transportation Needs: Not on file  Physical Activity: Not on file  Stress: Not on file  Social Connections: Not on  file   No outpatient medications prior to visit.   No facility-administered medications prior to visit.   No Known Allergies  Immunization History  Administered Date(s) Administered  . DTaP 09/22/1996, 11/22/1996, 01/23/1997, 07/26/1997  . H1N1 11/01/2008  . HIB (PRP-OMP) 09/22/1996, 11/22/1996, 01/23/1997, 07/26/1997  . Hepatitis B 06-14-1996, 09/22/1996, 01/23/1997  . IPV 09/22/1996, 11/22/1996, 07/26/1997  . Influenza Nasal 11/01/2008, 11/07/2009  . Influenza,inj,Quad PF,6+ Mos 03/10/2014  . Influenza-Unspecified 12/16/2012  . MMR 07/26/1997  . Meningococcal Conjugate 07/16/2007  . Td 07/16/2007  . Tdap 07/16/2007  . Varicella 07/16/2007    Health Maintenance  Topic Date Due  . HPV VACCINES (1 - Male 2-dose series) Never done  . HIV Screening  Never done  . Hepatitis C Screening  Never done  . TETANUS/TDAP  07/15/2017  . INFLUENZA VACCINE  07/15/2022    Patient Care Team: Mardene Speak, Hershal Coria as PCP - General (Physician Assistant)  Review of Systems  Musculoskeletal:  Positive for back pain.  All other systems reviewed and are negative.  Except see HPI {Labs  Heme  Chem  Endocrine  Serology  Results Review (optional):23779}   Objective    BP 137/85 (BP Location: Right Arm, Patient Position: Sitting, Cuff Size: Normal)   Pulse 75   Resp  14   Ht _0  (1.803 m)   Wt 150 lb (68 kg)   SpO2 99%   BMI 20.92 kg/m  {Show previous vital signs (optional):23777}  Physical Exam Vitals reviewed.  Constitutional:      General: He is not in acute distress.    Appearance: Normal appearance. He is not diaphoretic.  HENT:     Head: Normocephalic and atraumatic.     Nose: Congestion and rhinorrhea present.     Mouth/Throat:     Tonsils: No tonsillar exudate or tonsillar abscesses. 3+ on the right. 3+ on the left.  Eyes:     General: No scleral icterus.    Conjunctiva/sclera: Conjunctivae normal.  Cardiovascular:     Rate and Rhythm: Normal rate and regular  rhythm.     Pulses: Normal pulses.     Heart sounds: Normal heart sounds. No murmur heard. Pulmonary:     Effort: Pulmonary effort is normal. No respiratory distress.     Breath sounds: Normal breath sounds. No wheezing or rhonchi.  Musculoskeletal:     Cervical back: Normal range of motion and neck supple.     Right lower leg: No edema.     Left lower leg: No edema.  Lymphadenopathy:     Cervical: Cervical adenopathy present.  Skin:    General: Skin is warm and dry.     Findings: No rash.  Neurological:     Mental Status: He is alert and oriented to person, place, and time. Mental status is at baseline.  Psychiatric:        Mood and Affect: Mood normal.        Behavior: Behavior normal.    Depression Screen    04/02/2022   11:24 AM 08/14/2021   10:34 AM  PHQ 2/9 Scores  PHQ - 2 Score 0 0   No results found for any visits on 10/21/22.  Assessment & Plan     1. Seasonal allergic rhinitis due to pollen *** - fluticasone (FLONASE) 50 MCG/ACT nasal spray; Place 2 sprays into both nostrils daily.  Dispense: 16 g; Refill: 6 - cetirizine (ZYRTEC) 10 MG tablet; Take 1 tablet (10 mg total) by mouth daily.  Dispense: 30 tablet; Refill: 11   No follow-ups on file.     The patient was advised to call back or seek an in-person evaluation if the symptoms worsen or if the condition fails to improve as anticipated.  I discussed the assessment and treatment plan with the patient. The patient was provided an opportunity to ask questions and all were answered. The patient agreed with the plan and demonstrated an understanding of the instructions.  The entirety of the information documented in the History of Present Illness, Review of Systems and Physical Exam were personally obtained by me. Portions of this information were initially documented by the CMA and reviewed by me for thoroughness and accuracy.  Portions of this note were created using dictation software and may contain  typographical errors.     Mardene Speak, PA-C  [facility] [phone] (phone) [phone] (fax)  [place]

## 2022-10-22 DIAGNOSIS — R5383 Other fatigue: Secondary | ICD-10-CM | POA: Insufficient documentation

## 2022-10-22 DIAGNOSIS — J301 Allergic rhinitis due to pollen: Secondary | ICD-10-CM | POA: Insufficient documentation

## 2022-10-24 ENCOUNTER — Encounter: Payer: Self-pay | Admitting: Physician Assistant

## 2022-10-29 NOTE — Progress Notes (Signed)
I,Roshena L Chambers,acting as a Neurosurgeon for [employer], PA-C.,have documented all relevant documentation on the behalf of Debera Lat, PA-C,as directed by  [employer], PA-C while in the presence of [employer], PA-C.   Established patient visit   Patient: Scott Sandoval.   DOB: [birth year]   26 y.o. Male  MRN: [id number] Visit Date: 10/30/2022  Today's healthcare provider: Debera Lat, PA-C   Chief Complaint  Patient presents with   hypersomnia   Subjective    HPI  Hypersomnia: Patient complains of having trouble staying asleep. This has been a chronic problem for several years? 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 He has been having trouble to maintain a sleep at night when he does not need to wake up at night. His partner has been breastfeeding and requires a lot of care when he is off work. He works full-time.  Medications: Outpatient Medications Prior to Visit  Medication Sig   cetirizine (ZYRTEC) 10 MG tablet Take 1 tablet (10 mg total) by mouth daily.   fluticasone (FLONASE) 50 MCG/ACT nasal spray Place 2 sprays into both nostrils daily.   No facility-administered medications prior to visit.    Review of Systems  Constitutional:  Negative for appetite change, chills and fever.  Respiratory:  Negative for chest tightness, shortness of breath and wheezing.   Cardiovascular:  Negative for chest pain and palpitations.  Gastrointestinal:  Negative for abdominal pain, nausea and vomiting.       Objective    BP 134/80 (BP Location: Left Arm)   Pulse 69   Temp 98.3 F (36.8 C) (Oral)   Resp 14   Wt 149 lb (67.6 kg)   BMI 20.78 kg/m      Physical Exam Constitutional:      General: He is not in acute distress.    Appearance: Normal appearance. He is not diaphoretic.   HENT:     Head: Normocephalic.  Eyes:     Conjunctiva/sclera: Conjunctivae normal.  Pulmonary:     Effort: Pulmonary effort is normal. No respiratory distress.  Neurological:     Mental Status: He is alert and oriented to person, place, and time. Mental status is at baseline.      No results found for any visits on 10/30/22.  Assessment & Plan       1. 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 No depression or anxiety symptoms. Denies alcohol&drug abuse Declined sleep studies  Pt instruction for sleep problems: Advise good sleep hygiene. Cognitive-behavioral therapy is first-line treatment for chronic insomnia if symptoms persist for a few months Cognitive-behavioral therapy (including relaxation therapy): effective and considered more useful than medications; recommended initial treatment for patients with chronic insomnia; no improvement of efficacy when combined with medication. Sleep diary and instruction for filling out the form was provided to pt  At this point, pt worries about insurance coverage for CBT And declined the referral. Pt was informed that a current wait time for an appt  with psychology - a few months. He will contact us if he will want to proceed with CBT.  The following OTC drug could be used 2 to 3 times a week to see if that could improve sleep: Melatonin 2mg  slow release tablet: decreases sleep latency when taken 30 to 120 minutes prior to bedtime  FU as scheduled.    The patient was advised to call back or seek an in-person evaluation if the symptoms worsen or if the condition fails to improve as anticipated.  I discussed the assessment and treatment plan with  the patient. The patient was provided an opportunity to ask questions and all were answered. The patient agreed with the plan and demonstrated an understanding of the instructions.  The entirety of the information documented in the History of Present Illness, Review of Systems and Physical Exam were personally obtained by me. Portions of this information were initially documented by the CMA and reviewed by me for thoroughness and accuracy.   I spent 20 minutes caring for this patient today face to face, evaluating, counseling and educating the patient on sleep disturbances and difference between lack of sleep and sleep apnea and other sleep disorders, advising on appropriate management and documenting in record.  , [facility], MMS [facility] [phone] (phone) [phone] (fax)

## 2022-10-30 ENCOUNTER — Encounter: Payer: Self-pay | Admitting: Physician Assistant

## 2022-10-30 ENCOUNTER — Ambulatory Visit (INDEPENDENT_AMBULATORY_CARE_PROVIDER_SITE_OTHER): Payer: 59 | Admitting: Physician Assistant

## 2022-10-30 VITALS — BP 134/80 | HR 69 | Temp 98.3°F | Resp 14 | Wt 149.0 lb

## 2022-10-30 DIAGNOSIS — G4700 Insomnia, unspecified: Secondary | ICD-10-CM

## 2022-11-26 NOTE — Progress Notes (Deleted)
I,Sha'taria Melbert Botelho,acting as a Education administrator for [employer], PA-C.,have documented all relevant documentation on the behalf of Mardene Speak, PA-C,as directed by  [employer], PA-C while in the presence of [employer], PA-C.  Complete physical exam   Patient: Scott Sandoval.   DOB: [birth year]   26 y.o. Male  MRN: [id number] Visit Date: 11/27/2022  Today's healthcare provider: Mardene Speak, PA-C   No chief complaint on file.  Subjective    Scott Sandoval. is a 26 y.o. male who presents today for a complete physical exam.  He reports consuming a {diet types:17450} diet. {Exercise:19826} He generally feels {well/fairly well/poorly:18703}. He reports sleeping {well/fairly well/poorly:18703}. He {does/does not:200015} have additional problems to discuss today.  HPI  ***  No past medical history on file. No past surgical history on file. Social History   Socioeconomic History   Marital status: Single    Spouse name: Not on file   Number of children: 2   Years of education: Not on file   Highest education level: Not on file  Occupational History   Not on file  Tobacco Use   Smoking status: Never   Smokeless tobacco: Never  Vaping Use   Vaping Use: Never used  Substance and Sexual Activity   Alcohol use: Yes    Comment: occ.   Drug use: Yes    Types: Marijuana   Sexual activity: Yes  Other Topics Concern   Not on file  Social History Narrative   Not on file   Social Determinants of Health   Financial Resource Strain: Not on file  Food Insecurity: Not on file  Transportation Needs: Not on file  Physical Activity: Not on file  Stress: Not on file  Social Connections: Not on file  Intimate Partner Violence: Not on file   Family Status  Relation Name Status   MGM  (Not Specified)   PGM  (Not Specified)   Neg Hx  (Not Specified)   Family History  Problem Relation Age of Onset   Diabetes Maternal Grandmother    Diabetes Paternal Grandmother    Kidney  cancer Neg Hx    Kidney disease Neg Hx    Prostate cancer Neg Hx    No Known Allergies  Patient Care Team: Mardene Speak, PA-C as PCP - General (Physician Assistant)   Medications: Outpatient Medications Prior to Visit  Medication Sig   cetirizine (ZYRTEC) 10 MG tablet Take 1 tablet (10 mg total) by mouth daily.   fluticasone (FLONASE) 50 MCG/ACT nasal spray Place 2 sprays into both nostrils daily.   No facility-administered medications prior to visit.    Review of Systems  {Labs  Heme  Chem  Endocrine  Serology  Results Review (optional):23779}  Objective    There were no vitals taken for this visit. {Show previous vital signs (optional):23777}   Physical Exam  ***  Last depression screening scores    10/21/2022    9:39 AM 04/02/2022   11:24 AM 08/14/2021   10:34 AM  PHQ 2/9 Scores  PHQ - 2 Score 0 0 0  PHQ- 9 Score 0     Last fall risk screening    10/21/2022    9:39 AM  [place] in the past year? 0  Number falls in past yr: 0  Injury with Fall? 0  Risk for fall due to : No Fall Risks  Follow up Falls evaluation completed   Last Audit-C alcohol use screening  10/21/2022    9:38 AM  Alcohol Use Disorder Test (AUDIT)  1. How often do you have a drink containing alcohol? 0  2. How many drinks containing alcohol do you have on a typical day when you are drinking? 0  3. How often do you have six or more drinks on one occasion? 0  AUDIT-C Score 0   A score of 3 or more in women, and 4 or more in men indicates increased risk for alcohol abuse, EXCEPT if all of the points are from question 1   No results found for any visits on 11/27/22.  Assessment & Plan    Routine Health Maintenance and Physical Exam  Exercise Activities and Dietary recommendations  Goals   None     Immunization History  Administered Date(s) Administered   DTaP 09/22/1996, 11/22/1996, 01/23/1997, 07/26/1997   H1N1 11/01/2008   HIB (PRP-OMP) 09/22/1996, 11/22/1996,  01/23/1997, 07/26/1997   Hepatitis B [birth year]09[birth year]96, 09/22/1996, 01/23/1997   IPV 09/22/1996, 11/22/1996, 07/26/1997   Influenza Nasal 11/01/2008, 11/07/2009   Influenza,inj,Quad PF,6+ Mos 03/10/2014   Influenza-Unspecified 12/16/2012   MMR 07/26/1997   Meningococcal Conjugate 07/16/2007   Moderna Sars-Covid-2 Vaccination 04/19/2020, 12/18/2020   Td 07/16/2007   Tdap 07/16/2007, 10/01/2022   Varicella 07/16/2007    Health Maintenance  Topic Date Due   HPV VACCINES (1 - Male 2-dose series) Never done   HIV Screening  Never done   Hepatitis C Screening  Never done   INFLUENZA VACCINE  07/15/2022   COVID-19 Vaccine (3 - 2023-24 season) 08/15/2022   DTaP/Tdap/Td (8 - Td or Tdap) 10/01/2032    Discussed health benefits of physical activity, and encouraged him to engage in regular exercise appropriate for his age and condition.  ***  No follow-ups on file.     {provider attestation***:1}   Mardene Speak, Hershal Coria  [facility] [phone] (phone) [phone] (fax)  [place]

## 2022-11-27 ENCOUNTER — Encounter: Payer: 59 | Admitting: Physician Assistant

## 2022-12-12 NOTE — Progress Notes (Deleted)
Complete physical exam   Patient: Scott Sandoval.   DOB: 08-24-1996   26 y.o. Male  MRN: [id number] Visit Date: 12/17/2022  Today's healthcare provider: Mardene Speak, PA-C   No chief complaint on file.  Subjective    Clemons Salvucci. is a 26 y.o. male who presents today for a complete physical exam.  He reports consuming a {diet types:17450} diet. {Exercise:19826} He generally feels {well/fairly well/poorly:18703}. He reports sleeping {well/fairly well/poorly:18703}. He {does/does not:200015} have additional problems to discuss today.  HPI  ***  No past medical history on file. No past surgical history on file. Social History   Socioeconomic History   Marital status: Single    Spouse name: Not on file   Number of children: 2   Years of education: Not on file   Highest education level: Not on file  Occupational History   Not on file  Tobacco Use   Smoking status: Never   Smokeless tobacco: Never  Vaping Use   Vaping Use: Never used  Substance and Sexual Activity   Alcohol use: Yes    Comment: occ.   Drug use: Yes    Types: Marijuana   Sexual activity: Yes  Other Topics Concern   Not on file  Social History Narrative   Not on file   Social Determinants of Health   Financial Resource Strain: Not on file  Food Insecurity: Not on file  Transportation Needs: Not on file  Physical Activity: Not on file  Stress: Not on file  Social Connections: Not on file  Intimate Partner Violence: Not on file   Family Status  Relation Name Status   MGM  (Not Specified)   PGM  (Not Specified)   Neg Hx  (Not Specified)   Family History  Problem Relation Age of Onset   Diabetes Maternal Grandmother    Diabetes Paternal Grandmother    Kidney cancer Neg Hx    Kidney disease Neg Hx    Prostate cancer Neg Hx    No Known Allergies  Patient Care Team: Mardene Speak, PA-C as PCP - General (Physician Assistant)   Medications: Outpatient Medications Prior to  Visit  Medication Sig   cetirizine (ZYRTEC) 10 MG tablet Take 1 tablet (10 mg total) by mouth daily.   fluticasone (FLONASE) 50 MCG/ACT nasal spray Place 2 sprays into both nostrils daily.   No facility-administered medications prior to visit.    Review of Systems  {Labs  Heme  Chem  Endocrine  Serology  Results Review (optional):23779}  Objective    There were no vitals taken for this visit. {Show previous vital signs (optional):23777}   Physical Exam  ***  Last depression screening scores    10/21/2022    9:39 AM 04/02/2022   11:24 AM 08/14/2021   10:34 AM  PHQ 2/9 Scores  PHQ - 2 Score 0 0 0  PHQ- 9 Score 0     Last fall risk screening    10/21/2022    9:39 AM  [place] in the past year? 0  Number falls in past yr: 0  Injury with Fall? 0  Risk for fall due to : No Fall Risks  Follow up Falls evaluation completed   Last Audit-C alcohol use screening    10/21/2022    9:38 AM  Alcohol Use Disorder Test (AUDIT)  1. How often do you have a drink containing alcohol? 0  2. How many drinks containing alcohol do  you have on a typical day when you are drinking? 0  3. How often do you have six or more drinks on one occasion? 0  AUDIT-C Score 0   A score of 3 or more in women, and 4 or more in men indicates increased risk for alcohol abuse, EXCEPT if all of the points are from question 1   No results found for any visits on 12/17/22.  Assessment & Plan    Routine Health Maintenance and Physical Exam  Exercise Activities and Dietary recommendations  Goals   None     Immunization History  Administered Date(s) Administered   DTaP 09/22/1996, 11/22/1996, 01/23/1997, 07/26/1997   H1N1 11/01/2008   HIB (PRP-OMP) 09/22/1996, 11/22/1996, 01/23/1997, 07/26/1997   Hepatitis B 07-09-96, 09/22/1996, 01/23/1997   IPV 09/22/1996, 11/22/1996, 07/26/1997   Influenza Nasal 11/01/2008, 11/07/2009   Influenza,inj,Quad PF,6+ Mos 03/10/2014    Influenza-Unspecified 12/16/2012   MMR 07/26/1997   Meningococcal Conjugate 07/16/2007   Moderna Sars-Covid-2 Vaccination 04/19/2020, 12/18/2020   Td 07/16/2007   Tdap 07/16/2007, 10/01/2022   Varicella 07/16/2007    Health Maintenance  Topic Date Due   HPV VACCINES (1 - Male 2-dose series) Never done   HIV Screening  Never done   Hepatitis C Screening  Never done   INFLUENZA VACCINE  07/15/2022   COVID-19 Vaccine (3 - 2023-24 season) 08/15/2022   DTaP/Tdap/Td (8 - Td or Tdap) 10/01/2032    Discussed health benefits of physical activity, and encouraged him to engage in regular exercise appropriate for his age and condition.  ***

## 2022-12-17 ENCOUNTER — Encounter: Payer: 59 | Admitting: Physician Assistant

## 2022-12-25 ENCOUNTER — Encounter: Payer: Self-pay | Admitting: Physician Assistant

## 2022-12-25 ENCOUNTER — Ambulatory Visit (INDEPENDENT_AMBULATORY_CARE_PROVIDER_SITE_OTHER): Payer: 59 | Admitting: Physician Assistant

## 2022-12-25 VITALS — BP 134/75 | HR 82 | Ht 71.0 in | Wt 148.0 lb

## 2022-12-25 DIAGNOSIS — R5383 Other fatigue: Secondary | ICD-10-CM | POA: Diagnosis not present

## 2022-12-25 DIAGNOSIS — Z114 Encounter for screening for human immunodeficiency virus [HIV]: Secondary | ICD-10-CM

## 2022-12-25 DIAGNOSIS — G8929 Other chronic pain: Secondary | ICD-10-CM

## 2022-12-25 DIAGNOSIS — M545 Low back pain, unspecified: Secondary | ICD-10-CM | POA: Diagnosis not present

## 2022-12-25 DIAGNOSIS — G4700 Insomnia, unspecified: Secondary | ICD-10-CM | POA: Diagnosis not present

## 2022-12-25 DIAGNOSIS — Z7289 Other problems related to lifestyle: Secondary | ICD-10-CM

## 2022-12-25 DIAGNOSIS — J301 Allergic rhinitis due to pollen: Secondary | ICD-10-CM | POA: Diagnosis not present

## 2022-12-25 DIAGNOSIS — Z1159 Encounter for screening for other viral diseases: Secondary | ICD-10-CM

## 2022-12-25 DIAGNOSIS — F5221 Male erectile disorder: Secondary | ICD-10-CM

## 2022-12-25 DIAGNOSIS — F17200 Nicotine dependence, unspecified, uncomplicated: Secondary | ICD-10-CM

## 2022-12-25 NOTE — Progress Notes (Signed)
Complete physical exam   Patient: Scott Sandoval.   DOB: 26-Nov-1996   26 y.o. Male  MRN: [id number] Visit Date: 12/25/2022  Today's healthcare provider: Mardene Speak, PA-C   Chief Complaint  Patient presents with   Annual Exam   Subjective    Scott Sandoval. is a 27 y.o. male who presents today for a complete physical exam.  He reports consuming a  heatlhy  diet. The patient has a physically strenuous job, but has no regular exercise apart from work.  He generally feels fairly well. He reports sleeping fairly well. He does not have additional problems to discuss today.  HPI HPI   Patient states that he has no question or concerns regarding today's CPE. Patient denies any issues with seasonal allergies at this time. Patient noted joint pain & back pain to review of systems check in sheet.  Last edited by Sarina Ill, CMA on 12/25/2022 10:20 AM.        No past medical history on file. No past surgical history on file. Social History   Socioeconomic History   Marital status: Single    Spouse name: Not on file   Number of children: 2   Years of education: Not on file   Highest education level: Not on file  Occupational History   Not on file  Tobacco Use   Smoking status: Never   Smokeless tobacco: Never  Vaping Use   Vaping Use: Never used  Substance and Sexual Activity   Alcohol use: Yes    Comment: occ.   Drug use: Yes    Types: Marijuana   Sexual activity: Yes  Other Topics Concern   Not on file  Social History Narrative   Not on file   Social Determinants of Health   Financial Resource Strain: Not on file  Food Insecurity: Not on file  Transportation Needs: Not on file  Physical Activity: Not on file  Stress: Not on file  Social Connections: Not on file  Intimate Partner Violence: Not on file   Family Status  Relation Name Status   MGM  (Not Specified)   PGM  (Not Specified)   Neg Hx  (Not Specified)   Family History  Problem  Relation Age of Onset   Diabetes Maternal Grandmother    Diabetes Paternal Grandmother    Kidney cancer Neg Hx    Kidney disease Neg Hx    Prostate cancer Neg Hx    No Known Allergies  Patient Care Team: Mardene Speak, PA-C as PCP - General (Physician Assistant)   Medications: Outpatient Medications Prior to Visit  Medication Sig   fluticasone (FLONASE) 50 MCG/ACT nasal spray Place 2 sprays into both nostrils daily.   cetirizine (ZYRTEC) 10 MG tablet Take 1 tablet (10 mg total) by mouth daily. (Patient not taking: Reported on 12/25/2022)   No facility-administered medications prior to visit.    Review of Systems  All other systems reviewed and are negative. Except see HPI    Objective    BP 134/75 (BP Location: Left Arm, Patient Position: Sitting, Cuff Size: Normal)   Pulse 82   Ht 5\' 11"  (1.803 m)   Wt 148 lb (67.1 kg)   SpO2 100%   BMI 20.64 kg/m     Physical Exam Vitals and nursing note reviewed.  Constitutional:      General: He is not in acute distress.    Appearance: Normal appearance. He is obese.  HENT:  Head: Normocephalic and atraumatic.     Right Ear: Tympanic membrane, ear canal and external ear normal.     Left Ear: Ear canal and external ear normal. There is impacted cerumen (partially).     Nose: Nose normal.     Mouth/Throat:     Mouth: Mucous membranes are moist.     Pharynx: Oropharynx is clear.  Eyes:     General: No scleral icterus.       Right eye: No discharge.        Left eye: No discharge.     Extraocular Movements: Extraocular movements intact.     Conjunctiva/sclera: Conjunctivae normal.     Pupils: Pupils are equal, round, and reactive to light.  Cardiovascular:     Rate and Rhythm: Normal rate and regular rhythm.     Pulses: Normal pulses.     Heart sounds: Normal heart sounds.  Pulmonary:     Effort: Pulmonary effort is normal.     Breath sounds: Normal breath sounds.  Abdominal:     General: Abdomen is flat. Bowel  sounds are normal. There is no distension.     Palpations: Abdomen is soft. There is no mass.     Tenderness: There is no abdominal tenderness. There is no right CVA tenderness, left CVA tenderness, guarding or rebound.     Hernia: No hernia is present.  Musculoskeletal:        General: No swelling, tenderness, deformity or signs of injury. Normal range of motion.     Cervical back: Normal range of motion.     Right lower leg: No edema.     Left lower leg: No edema.  Skin:    General: Skin is warm.     Capillary Refill: Capillary refill takes less than 2 seconds.     Findings: No rash.  Neurological:     General: No focal deficit present.     Mental Status: He is alert and oriented to person, place, and time.     Cranial Nerves: No cranial nerve deficit.     Sensory: No sensory deficit.     Motor: No weakness.     Coordination: Coordination normal.     Gait: Gait normal.     Deep Tendon Reflexes: Reflexes normal.  Psychiatric:        Mood and Affect: Mood normal.        Behavior: Behavior normal.        Thought Content: Thought content normal.        Judgment: Judgment normal.      Last depression screening scores    12/25/2022   10:18 AM 10/21/2022    9:39 AM 04/02/2022   11:24 AM  PHQ 2/9 Scores  PHQ - 2 Score 0 0 0  PHQ- 9 Score 0 0    Last fall risk screening    12/25/2022   10:18 AM  Fall Risk   Falls in the past year? 0   Last Audit-C alcohol use screening    12/25/2022   10:19 AM  Alcohol Use Disorder Test (AUDIT)  1. How often do you have a drink containing alcohol? 1  2. How many drinks containing alcohol do you have on a typical day when you are drinking? 0  3. How often do you have six or more drinks on one occasion? 0  AUDIT-C Score 1   A score of 3 or more in women, and 4 or more in men indicates increased risk for alcohol  abuse, EXCEPT if all of the points are from question 1   No results found for any visits on 12/25/22.  Assessment & Plan     Routine Health Maintenance and Physical Exam  Exercise Activities and Dietary recommendations  Goals   Adherence to healthy diet and daily exercise routine     Immunization History  Administered Date(s) Administered   DTaP 09/22/1996, 11/22/1996, 01/23/1997, 07/26/1997   H1N1 11/01/2008   HIB (PRP-OMP) 09/22/1996, 11/22/1996, 01/23/1997, 07/26/1997   Hepatitis B 10-31-1996, 09/22/1996, 01/23/1997   IPV 09/22/1996, 11/22/1996, 07/26/1997   Influenza Nasal 11/01/2008, 11/07/2009   Influenza,inj,Quad PF,6+ Mos 03/10/2014   Influenza-Unspecified 12/16/2012   MMR 07/26/1997   Meningococcal Conjugate 07/16/2007   Moderna Sars-Covid-2 Vaccination 04/19/2020, 12/18/2020   Td 07/16/2007   Tdap 07/16/2007, 10/01/2022   Varicella 07/16/2007    Health Maintenance  Topic Date Due   HPV VACCINES (1 - Male 2-dose series) Never done   Hepatitis C Screening  Never done   INFLUENZA VACCINE  07/15/2022   COVID-19 Vaccine (3 - 2023-24 season) 08/15/2022   DTaP/Tdap/Td (8 - Td or Tdap) 10/01/2032   HIV Screening  Completed    Discussed health benefits of physical activity, and encouraged him to engage in regular exercise appropriate for his age and condition.  UTD on dental/eye Things to do to keep yourself healthy  - Exercise at least 30-45 minutes a day, 3-4 days a week.  - Eat a low-fat diet with lots of fruits and vegetables, up to 7-9 servings per day.  - Seatbelts can save your life. Wear them always.  - Smoke detectors on every level of your home, check batteries every year.  - Eye Doctor - have an eye exam every 1-2 years  - Safe sex - if you may be exposed to STDs, use a condom.  - Alcohol -  If you drink, do it moderately, less than 2 drinks per day.  - Health Care Power of Attorney. Choose someone to speak for you if you are not able.  - Depression is common in our stressful world.If you're feeling down or losing interest in things you normally enjoy, please come in for a  visit.  - Violence - If anyone is threatening or hurting you, please call immediately.   CPE in a year. Pt had Flu shot last year, was asked to send [place] his record. Pt decided to think about HPV vaccine. Denies blood work at this appt considering all labs in the past were WNL and he does not have any complaints today.  The patient was advised to call back or seek an in-person evaluation if the symptoms worsen or if the condition fails to improve as anticipated.  I discussed the assessment and treatment plan with the patient. The patient was provided an opportunity to ask questions and all were answered. The patient agreed with the plan and demonstrated an understanding of the instructions.  The entirety of the information documented in the History of Present Illness, Review of Systems and Physical Exam were personally obtained by me. Portions of this information were initially documented by the CMA and reviewed by me for thoroughness and accuracy.  Debera Lat, [facility], MMS [facility] [phone] (phone) [phone] (fax)

## 2023-02-02 ENCOUNTER — Encounter: Payer: Self-pay | Admitting: Physician Assistant

## 2023-02-02 ENCOUNTER — Ambulatory Visit (INDEPENDENT_AMBULATORY_CARE_PROVIDER_SITE_OTHER): Payer: Self-pay | Admitting: Physician Assistant

## 2023-02-02 VITALS — BP 101/68 | HR 111 | Temp 99.0°F | Wt 146.4 lb

## 2023-02-02 DIAGNOSIS — R197 Diarrhea, unspecified: Secondary | ICD-10-CM

## 2023-02-02 DIAGNOSIS — R112 Nausea with vomiting, unspecified: Secondary | ICD-10-CM

## 2023-02-02 NOTE — Progress Notes (Unsigned)
      I,Rhodesia Stanger R Kaysea Raya,acting as a Education administrator for [employer], PA-C.,have documented all relevant documentation on the behalf of Mardene Speak, PA-C,as directed by  [employer], PA-C while in the presence of [employer], PA-C.  Established patient visit   Patient: Scott Sandoval.   DOB: 11-10-1996   26 y.o. Male  MRN: [id number] Visit Date: 02/02/2023  Today's healthcare provider: Mardene Speak, PA-C   No chief complaint on file.  Subjective    HPI  Giannis Locher. is a 27 y.o. male who presents for evaluation of nausea and vomiting. Onset of symptoms was yesterday. Patient describes nausea as moderate. Vomiting has occurred 15+ times over the past 1 day. Vomitus is described as undigested food. Symptoms have been associated with headache and chills .  Patient denies fever. Symptoms have gradually improved.  Evaluation to date has been none. Treatment to date has been OTC pain relief.    Medications: Outpatient Medications Prior to Visit  Medication Sig   cetirizine (ZYRTEC) 10 MG tablet Take 1 tablet (10 mg total) by mouth daily. (Patient not taking: Reported on 12/25/2022)   fluticasone (FLONASE) 50 MCG/ACT nasal spray Place 2 sprays into both nostrils daily.   No facility-administered medications prior to visit.    Review of Systems  {Labs  Heme  Chem  Endocrine  Serology  Results Review (optional):23779}   Objective    There were no vitals taken for this visit. {Show previous vital signs (optional):23777}  Physical Exam  ***  No results found for any visits on 02/02/23.  Assessment & Plan     ***  No follow-ups on file.      {provider attestation***:1}   Mardene Speak, PA-C  [place] [phone] (phone) [phone] (fax)  [place]

## 2024-02-03 ENCOUNTER — Encounter: Payer: Self-pay | Admitting: Physician Assistant

## 2024-02-11 ENCOUNTER — Encounter: Payer: Self-pay | Admitting: Physician Assistant

## 2024-02-11 ENCOUNTER — Ambulatory Visit: Payer: BC Managed Care – PPO | Admitting: Physician Assistant

## 2024-02-11 VITALS — BP 143/78 | HR 76 | Resp 16 | Ht 71.0 in | Wt 147.0 lb

## 2024-02-11 DIAGNOSIS — R0981 Nasal congestion: Secondary | ICD-10-CM | POA: Diagnosis not present

## 2024-02-11 DIAGNOSIS — Z0000 Encounter for general adult medical examination without abnormal findings: Secondary | ICD-10-CM

## 2024-02-11 DIAGNOSIS — G8929 Other chronic pain: Secondary | ICD-10-CM

## 2024-02-11 DIAGNOSIS — M545 Low back pain, unspecified: Secondary | ICD-10-CM | POA: Diagnosis not present

## 2024-02-11 DIAGNOSIS — Z0001 Encounter for general adult medical examination with abnormal findings: Secondary | ICD-10-CM | POA: Diagnosis not present

## 2024-02-11 NOTE — Progress Notes (Unsigned)
 Complete physical exam  Patient: Scott Sandoval.   DOB: [birth year]   27 y.o. Male  MRN: [id number] Visit Date: 02/11/2024  Today's healthcare provider: Debera Lat, PA-C   Chief Complaint  Patient presents with   Annual Exam   Subjective    Scott Bommarito. is a 28 y.o. male who presents today for a complete physical exam.  He reports consuming a {diet types:17450} diet. {Exercise:19826} He generally feels {well/fairly well/poorly:18703}. He reports sleeping {well/fairly well/poorly:18703}. He {does/does not:200015} have additional problems to discuss today.  HPI  *** Discussed the use of AI scribe software for clinical note transcription with the patient, who gave verbal consent to proceed.  History of Present Illness            Last depression screening scores    02/11/2024    1:34 PM 02/02/2023    4:34 PM 12/25/2022   10:18 AM  PHQ 2/9 Scores  PHQ - 2 Score 0 0 0  PHQ- 9 Score 1 0 0   Last fall risk screening    02/02/2023    4:34 PM  Fall Risk   Falls in the past year? 0  Number falls in past yr: 0  Injury with Fall? 0   Last Audit-C alcohol use screening    02/10/2024    5:24 PM  Alcohol Use Disorder Test (AUDIT)  1. How often do you have a drink containing alcohol? 0  2. How many drinks containing alcohol do you have on a typical day when you are drinking? 0  3. How often do you have six or more drinks on one occasion? 0  AUDIT-C Score 0      Patient-reported   A score of 3 or more in women, and 4 or more in men indicates increased risk for alcohol abuse, EXCEPT if all of the points are from question 1   History reviewed. No pertinent past medical history. History reviewed. No pertinent surgical history. Social History   Socioeconomic History   Marital status: Single    Spouse name: Not on file   Number of children: 2   Years of education: Not on file   Highest education level: Associate degree: occupational, Scientist, product/process development, or vocational  program  Occupational History   Not on file  Tobacco Use   Smoking status: Never   Smokeless tobacco: Never  Vaping Use   Vaping status: Never Used  Substance and Sexual Activity   Alcohol use: Yes    Comment: occ.   Drug use: Not Currently    Types: Marijuana   Sexual activity: Yes  Other Topics Concern   Not on file  Social History Narrative   Not on file   Social Drivers of Health   Financial Resource Strain: Low Risk  (02/10/2024)   Overall Financial Resource Strain (CARDIA)    Difficulty of Paying Living Expenses: Not very hard  Food Insecurity: No Food Insecurity (02/10/2024)   Hunger Vital Sign    Worried About Running Out of Food in the Last Year: Never true    Ran Out of Food in the Last Year: Never true  Transportation Needs: No Transportation Needs (02/10/2024)   PRAPARE - Administrator, Civil Service (Medical): No    Lack of Transportation (Non-Medical): No  Physical Activity: Sufficiently Active (02/10/2024)   Exercise Vital Sign    Days of Exercise per Week: 5 days    Minutes of Exercise per Session: 150+ min  Stress: No Stress Concern Present (02/10/2024)   [employer] of Occupational Health - Occupational Stress Questionnaire    Feeling of Stress : Not at all  Social Connections: Unknown (02/10/2024)   Social Connection and Isolation Panel [NHANES]    Frequency of Communication with Friends and Family: Patient declined    Frequency of Social Gatherings with Friends and Family: Patient declined    Attends Religious Services: Patient declined    Database administrator or Organizations: No    Attends Engineer, structural: Not on file    Marital Status: Living with partner  Intimate Partner Violence: Not on file   Family Status  Relation Name Status   MGM  (Not Specified)   PGM Jennie (Not Specified)   Neg Hx  (Not Specified)  No partnership data on file   Family History  Problem Relation Age of Onset   Diabetes Maternal  Grandmother    Diabetes Paternal Grandmother    Kidney cancer Neg Hx    Kidney disease Neg Hx    Prostate cancer Neg Hx    No Known Allergies  Patient Care Team: Cherlynn Polo as PCP - General (Physician Assistant)   Medications: Outpatient Medications Prior to Visit  Medication Sig   [DISCONTINUED] cetirizine (ZYRTEC) 10 MG tablet Take 1 tablet (10 mg total) by mouth daily. (Patient not taking: Reported on 12/25/2022)   [DISCONTINUED] fluticasone (FLONASE) 50 MCG/ACT nasal spray Place 2 sprays into both nostrils daily.   No facility-administered medications prior to visit.    Review of Systems Except see HPI  {Insert previous labs (optional):23779} {See past labs  Heme  Chem  Endocrine  Serology  Results Review (optional):1}  Objective    BP (!) 143/78   Pulse 76   Resp 16   Ht 5\' 11"  (1.803 m)   Wt 147 lb (66.7 kg)   SpO2 99%   BMI 20.50 kg/m  {Insert last BP/Wt (optional):23777}{See vitals history (optional):1}    Physical Exam   No results found for any visits on 02/11/24.  Assessment & Plan    Routine Health Maintenance and Physical Exam  Exercise Activities and Dietary recommendations  Goals   None     Immunization History  Administered Date(s) Administered   DTaP 09/22/1996, 11/22/1996, 01/23/1997, 07/26/1997   H1N1 11/01/2008   HIB (PRP-OMP) 09/22/1996, 11/22/1996, 01/23/1997, 07/26/1997   Hepatitis B 10-24-96, 09/22/1996, 01/23/1997   IPV 09/22/1996, 11/22/1996, 07/26/1997   Influenza Nasal 11/01/2008, 11/07/2009   Influenza,inj,Quad PF,6+ Mos 03/10/2014   Influenza-Unspecified 12/16/2012   MMR 07/26/1997   Meningococcal Conjugate 07/16/2007   Moderna Sars-Covid-2 Vaccination 04/19/2020, 12/18/2020   Td 07/16/2007   Tdap 07/16/2007, 10/01/2022   Varicella 07/16/2007    Health Maintenance  Topic Date Due   HIV Screening  Never done   Hepatitis C Screening  Never done   Flu Shot  07/16/2023   COVID-19 Vaccine (3 - 2024-25  season) 08/16/2023   DTaP/Tdap/Td vaccine (8 - Td or Tdap) 10/01/2032   HPV Vaccine  Aged Out    Discussed health benefits of physical activity, and encouraged him to engage in regular exercise appropriate for his age and condition.  Assessment and Plan              ***  No follow-ups on file.    The patient was advised to call back or seek an in-person evaluation if the symptoms worsen or if the condition fails to improve as anticipated.  I discussed the assessment  and treatment plan with the patient. The patient was provided an opportunity to ask questions and all were answered. The patient agreed with the plan and demonstrated an understanding of the instructions.  I, Debera Lat, PA-C have reviewed all documentation for this visit. The documentation on 02/11/2024  for the exam, diagnosis, procedures, and orders are all accurate and complete.  Debera Lat, [facility], MMS [facility] [phone] (phone) [phone] (fax)  [facility]

## 2024-02-17 ENCOUNTER — Ambulatory Visit
Admission: RE | Admit: 2024-02-17 | Discharge: 2024-02-17 | Disposition: A | Discharge status: Home / Self Care | Source: Ambulatory Visit | Attending: Physician Assistant

## 2024-02-17 ENCOUNTER — Ambulatory Visit
Admission: RE | Admit: 2024-02-17 | Discharge: 2024-02-17 | Disposition: A | Discharge status: Home / Self Care | Attending: Physician Assistant | Admitting: Physician Assistant

## 2024-02-17 DIAGNOSIS — M545 Low back pain, unspecified: Secondary | ICD-10-CM | POA: Diagnosis not present

## 2024-02-17 DIAGNOSIS — M549 Dorsalgia, unspecified: Secondary | ICD-10-CM | POA: Diagnosis not present

## 2024-02-17 DIAGNOSIS — G8929 Other chronic pain: Secondary | ICD-10-CM | POA: Diagnosis not present

## 2024-02-22 ENCOUNTER — Encounter: Payer: Self-pay | Admitting: Physician Assistant

## 2024-03-03 ENCOUNTER — Encounter: Payer: Self-pay | Admitting: Physician Assistant

## 2024-05-29 DIAGNOSIS — L03113 Cellulitis of right upper limb: Secondary | ICD-10-CM | POA: Diagnosis not present
